# Patient Record
Sex: Male | Born: 1950 | Race: White | Hispanic: No | Marital: Married | Smoking: Never smoker
Health system: Southern US, Community
[De-identification: ages and names within clinical notes are randomized; demographics above are authoritative.]

## PROBLEM LIST (undated history)

## (undated) DIAGNOSIS — I499 Cardiac arrhythmia, unspecified: Secondary | ICD-10-CM

## (undated) HISTORY — PX: PROSTATE SURGERY: SHX751

---

## 2004-07-24 ENCOUNTER — Ambulatory Visit: Payer: Self-pay | Admitting: Unknown Physician Specialty

## 2008-11-02 ENCOUNTER — Emergency Department: Payer: Self-pay | Admitting: Emergency Medicine

## 2008-12-08 ENCOUNTER — Emergency Department: Payer: Self-pay | Admitting: Unknown Physician Specialty

## 2010-01-06 IMAGING — CR DG CHEST 2V
1 series · 3 of 3 positions shown · non-contrast
Comparison: none

REASON FOR EXAM: syncopal episode
COMMENTS:

PROCEDURE:     DXR - DXR CHEST PA (OR AP) AND LATERAL  - November 02, 2008 [DATE]
RESULT:     The lungs are clear. The cardiac silhouette and visualized bony
skeleton are unremarkable.

[Series 1: view not recorded · 0.17mm/px · 3 of 3 slices shown]
[im 1/3]
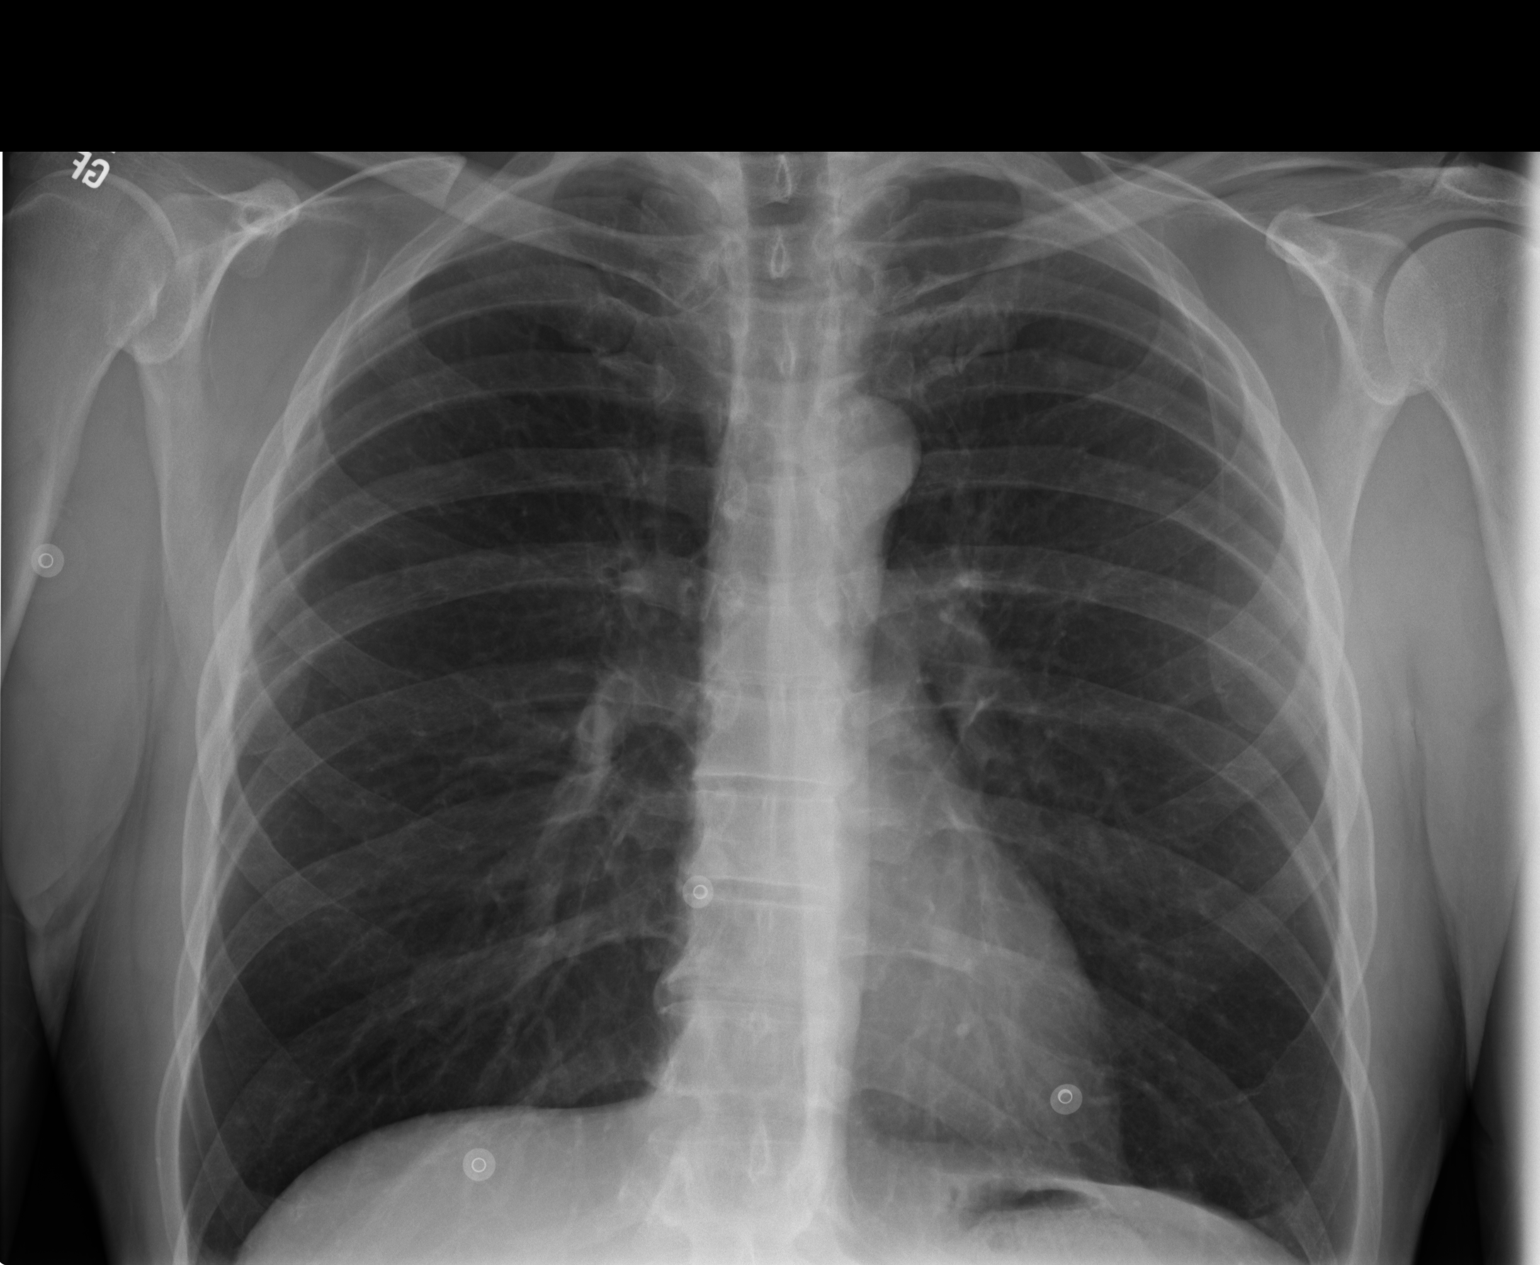
[im 2/3]
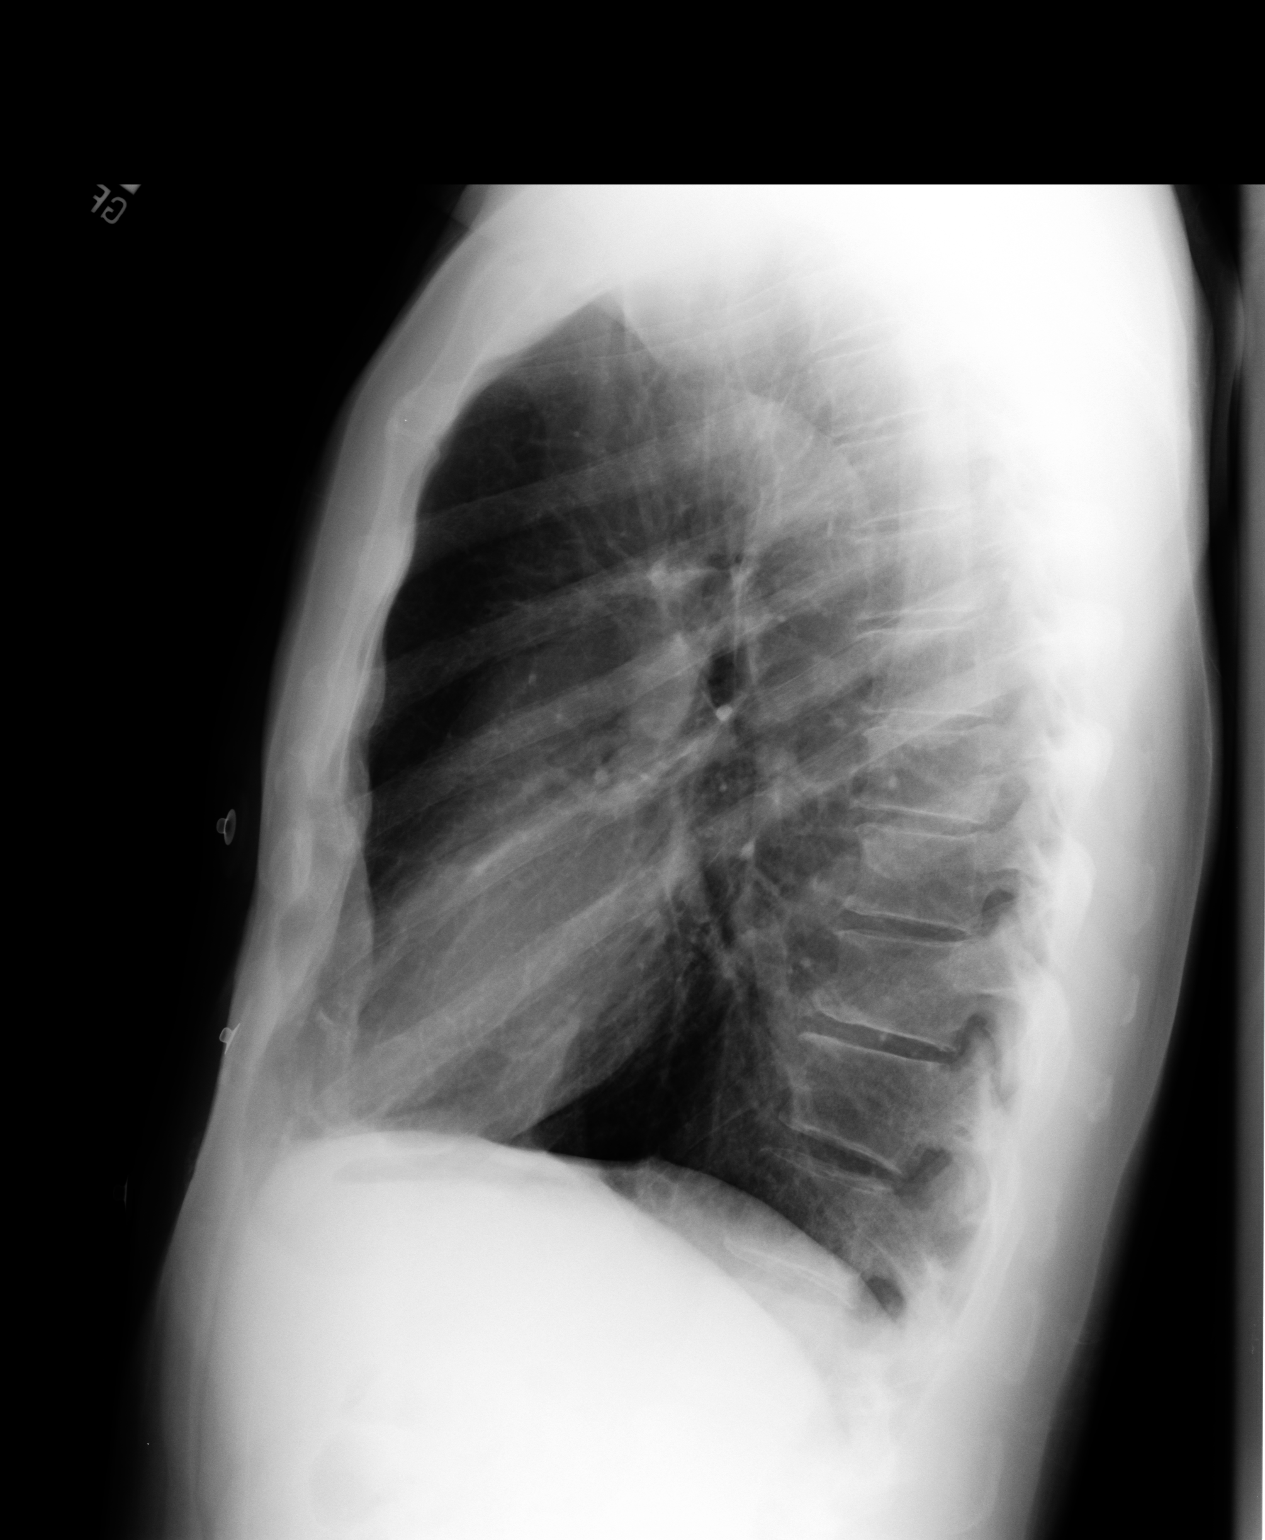
[im 3/3]
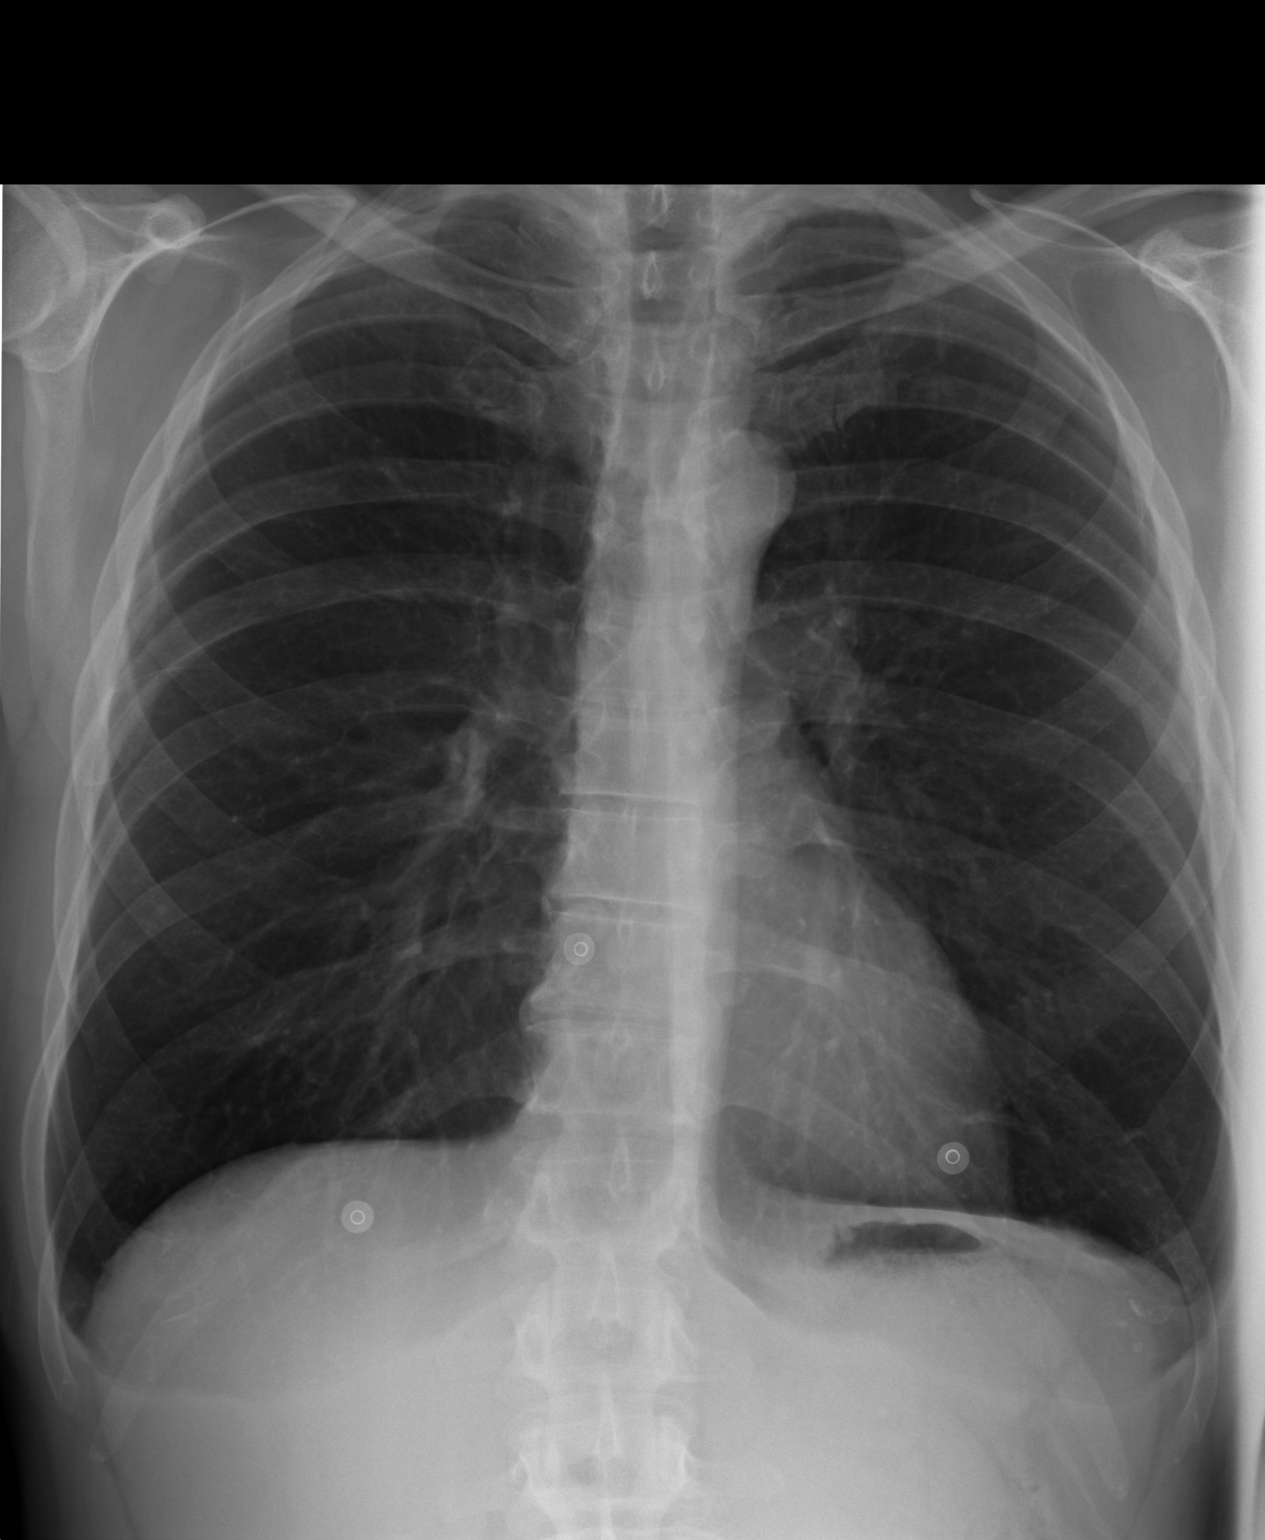

[3 of 3 positions shown; findings below may reference images not displayed]

IMPRESSION: 1. Chest radiograph without evidence of acute cardiopulmonary disease.

## 2010-02-11 IMAGING — CT CT ABD-PELV W/O CM
1 of 2 series · 15 of 32 positions shown, 19 images · non-contrast
Comparison: None

REASON FOR EXAM: (1) right flank pain; (2) right flank pain
COMMENTS:

PROCEDURE:     CT  - CT ABDOMEN AND PELVIS W[DATE]  [DATE]
RESULT:     Indication: Right flank pain
TECHNIQUE: Multiple axial images from the lung bases to the symphysis pubis
were obtained without oral or intravenous contrast.

[Series 2: soft tissue · axial · 0.67mm/px · z∈[-705,-327]mm · 15 of 143 slices shown, 19 images]
[im 11/143  soft-tissue]
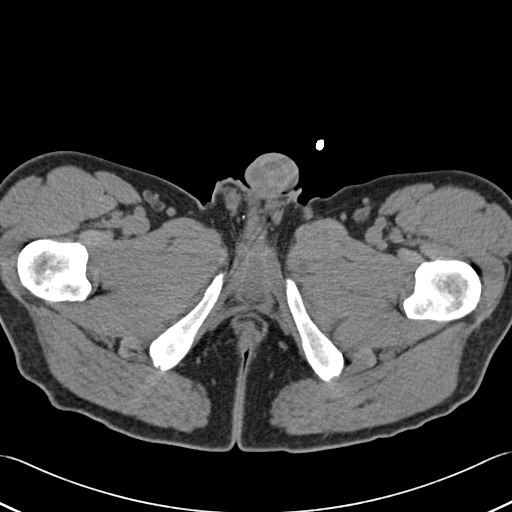
[im 11/143  bone]
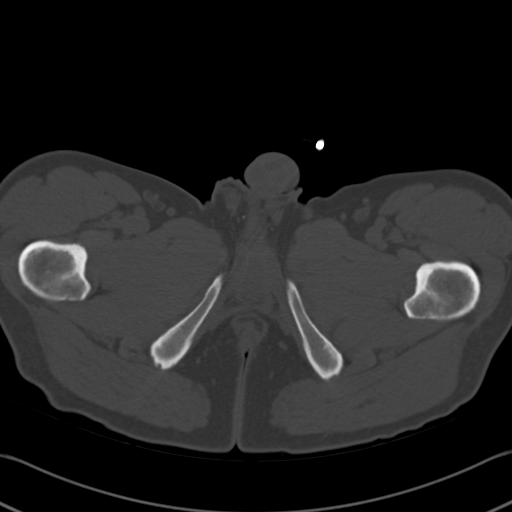
[im 22/143  soft-tissue]
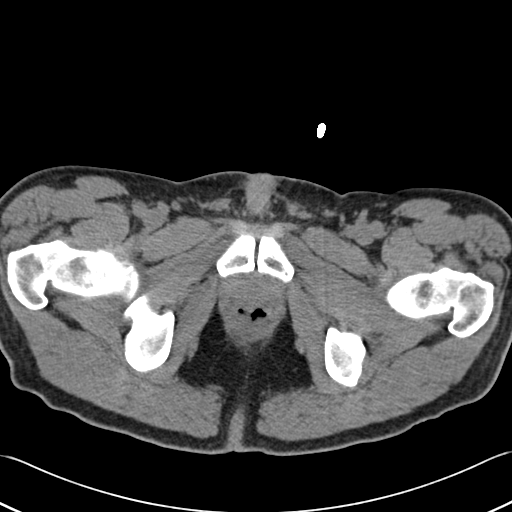
[im 32/143  soft-tissue]
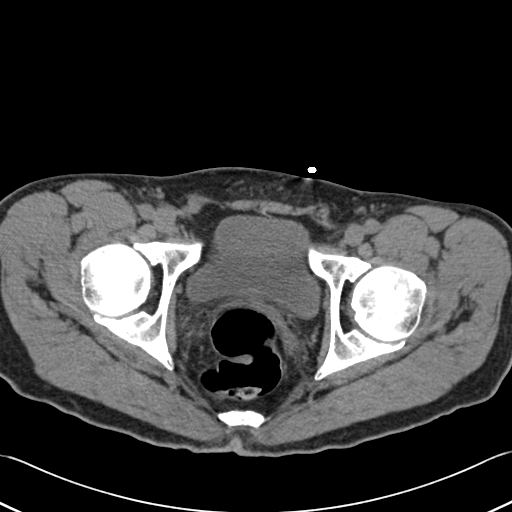
[im 43/143  soft-tissue]
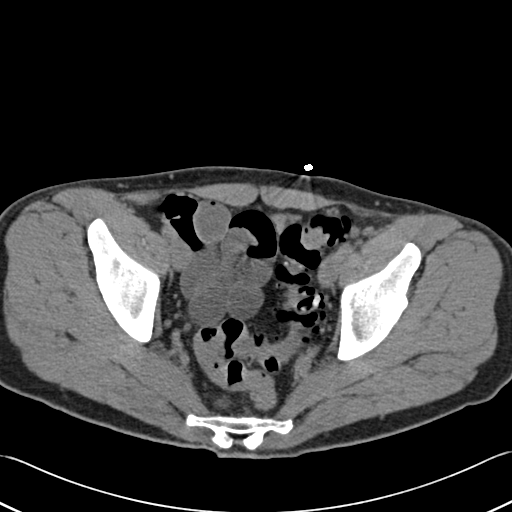
[im 53/143  soft-tissue]
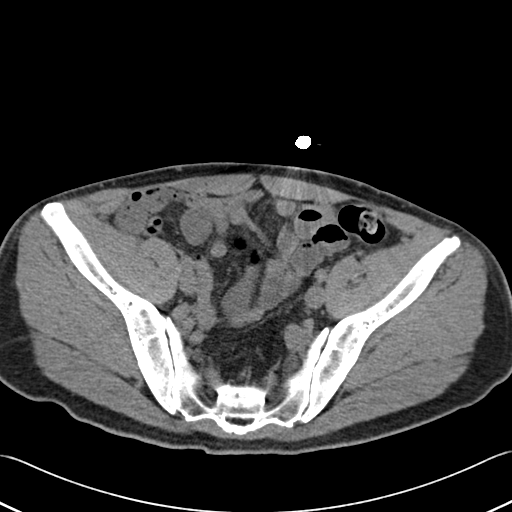
[im 64/143  soft-tissue]
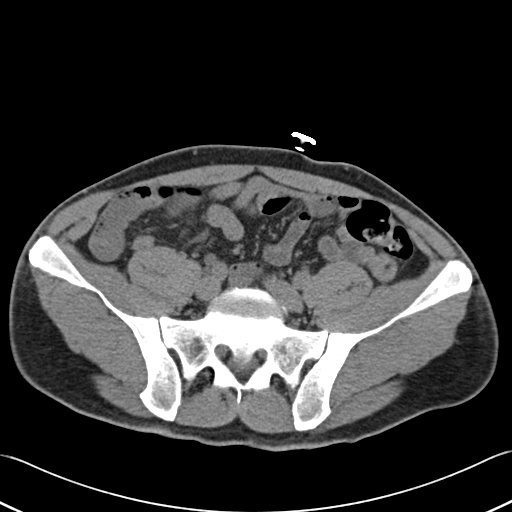
[im 74/143  soft-tissue]
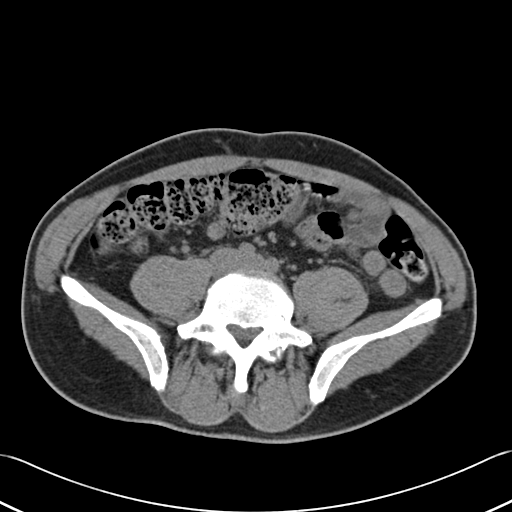
[im 85/143  soft-tissue]
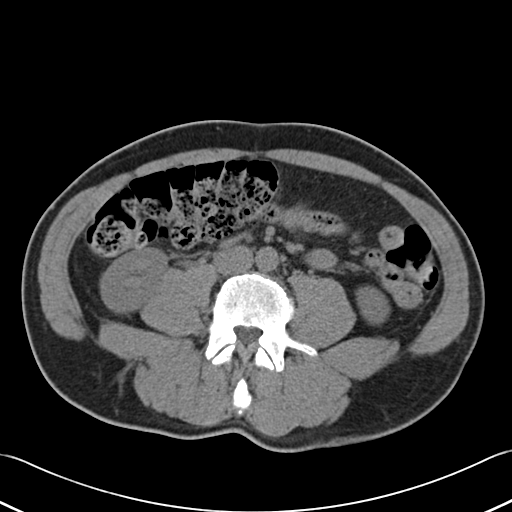
[im 95/143  soft-tissue]
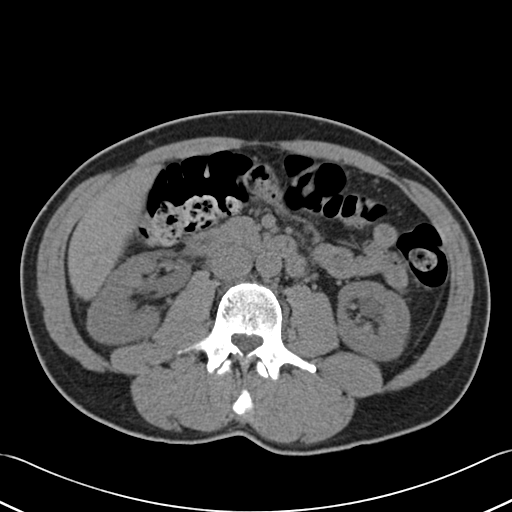
[im 95/143  bone]
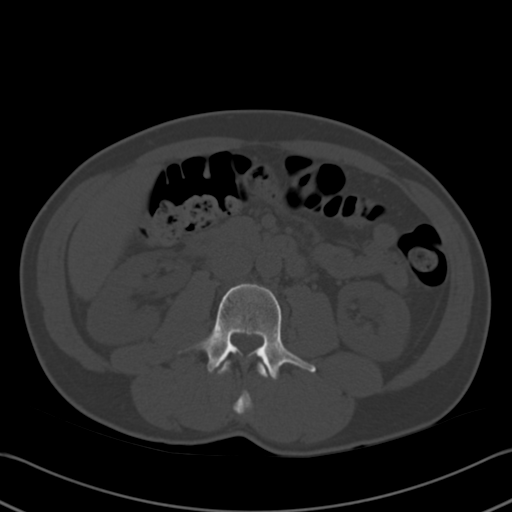
[im 106/143  soft-tissue]
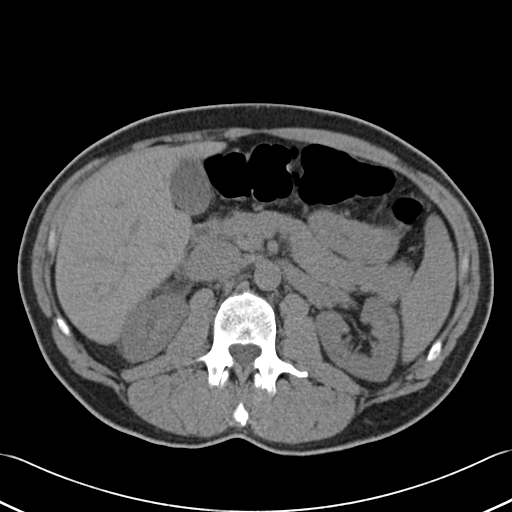
[im 116/143  soft-tissue]
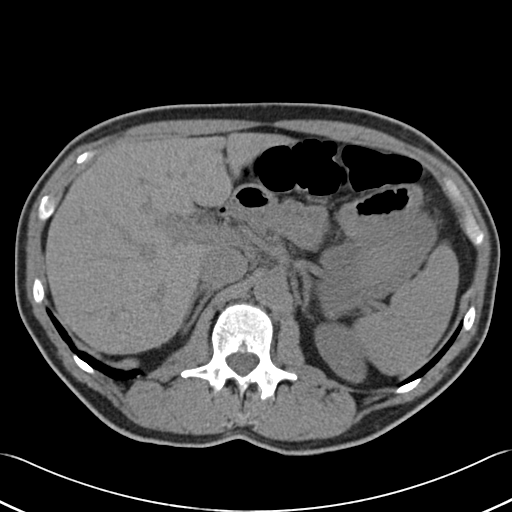
[im 121/143  lung]
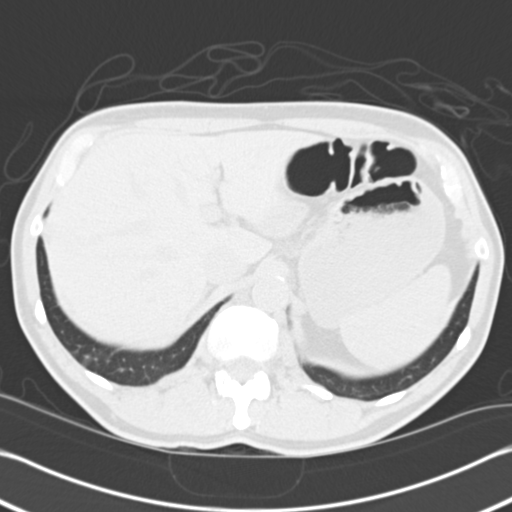
[im 127/143  soft-tissue]
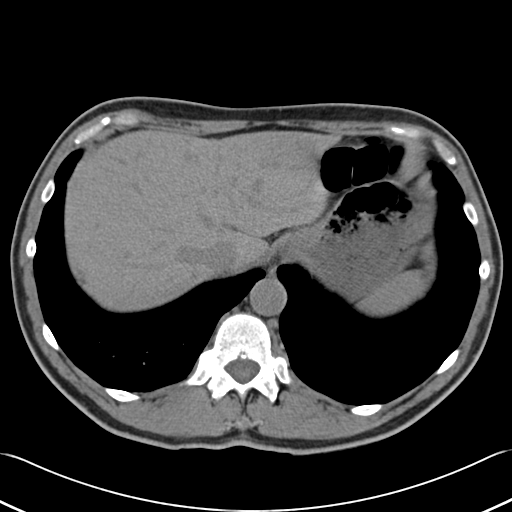
[im 127/143  lung]
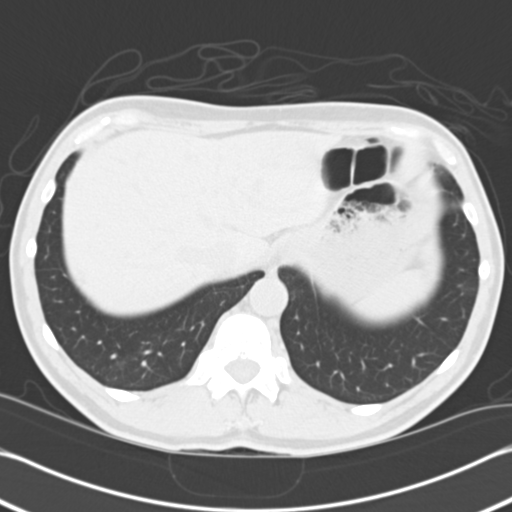
[im 132/143  lung]
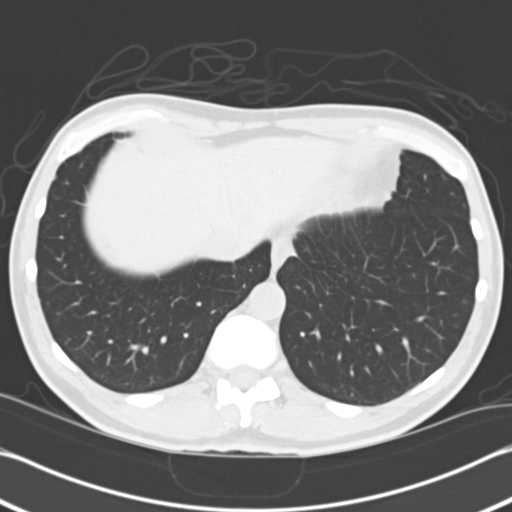
[im 137/143  soft-tissue]
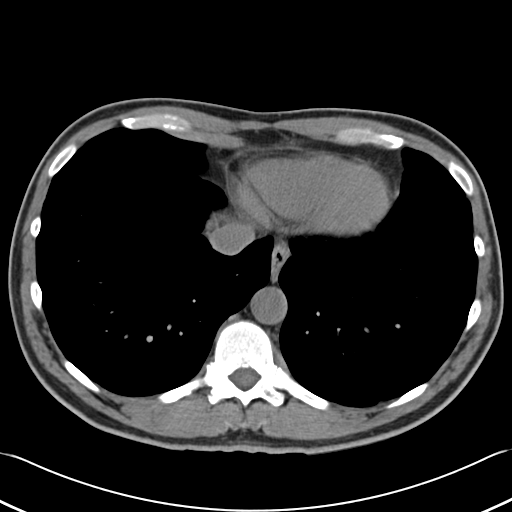
[im 137/143  lung]
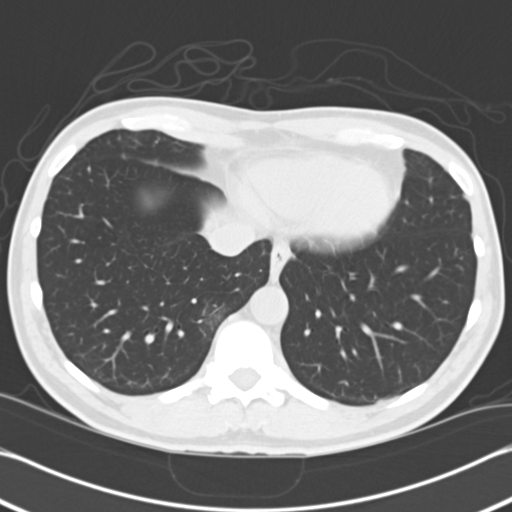

[15 of 32 positions shown; findings below may reference images not displayed]

FINDINGS: The lung bases are clear. There is no pleural or pericardial effusions.

There bilateral nonobstructing renal calculi. There is a 3-4 mm distal right
ureteral calculus just proximal to the ureterovesicular junction with mild
pelvicaliectasis. There is no other ureteral calculus. There is no bladder
calculus. There is mild right perinephric stranding. The kidneys are
symmetric in size without evidence for exophytic mass. The bladder is
unremarkable.

The liver demonstrates no focal abnormality. The gallbladder is
unremarkable. The spleen demonstrates no focal abnormality. The adrenal
glands and pancreas are normal.

The unopacified stomach, duodenum, small intestine, and large intestine are
unremarkable, but evaluation is limited by lack of oral contrast. There is
no pneumoperitoneum, pneumatosis, or portal venous gas. There is no
abdominal or pelvic free fluid. There is no lymphadenopathy.

The abdominal aorta is normal in caliber.

The osseous structures are unremarkable.
IMPRESSION: 1. There is a 3-4 mm distal right ureteral calculus just proximal to the
ureterovesicular junction with mild pelvicaliectasis. There are bilateral
nonobstructing renal calculi.

## 2014-08-09 ENCOUNTER — Ambulatory Visit
Admission: RE | Admit: 2014-08-09 | Discharge: 2014-08-09 | Disposition: A | Discharge status: Home / Self Care | Payer: BLUE CROSS/BLUE SHIELD | Source: Ambulatory Visit | Attending: Unknown Physician Specialty | Admitting: Unknown Physician Specialty

## 2014-08-09 ENCOUNTER — Ambulatory Visit: Payer: BLUE CROSS/BLUE SHIELD | Admitting: Anesthesiology

## 2014-08-09 ENCOUNTER — Encounter: Payer: Self-pay | Admitting: *Deleted

## 2014-08-09 ENCOUNTER — Encounter
Admission: RE | Disposition: A | Discharge status: Home / Self Care | Payer: Self-pay | Source: Ambulatory Visit | Attending: Unknown Physician Specialty

## 2014-08-09 DIAGNOSIS — K640 First degree hemorrhoids: Secondary | ICD-10-CM | POA: Diagnosis not present

## 2014-08-09 DIAGNOSIS — Z1211 Encounter for screening for malignant neoplasm of colon: Secondary | ICD-10-CM | POA: Diagnosis not present

## 2014-08-09 DIAGNOSIS — Z7982 Long term (current) use of aspirin: Secondary | ICD-10-CM | POA: Insufficient documentation

## 2014-08-09 DIAGNOSIS — Z79899 Other long term (current) drug therapy: Secondary | ICD-10-CM | POA: Insufficient documentation

## 2014-08-09 DIAGNOSIS — K5730 Diverticulosis of large intestine without perforation or abscess without bleeding: Secondary | ICD-10-CM | POA: Diagnosis not present

## 2014-08-09 HISTORY — DX: Cardiac arrhythmia, unspecified: I49.9

## 2014-08-09 HISTORY — PX: COLONOSCOPY: SHX5424

## 2014-08-09 SURGERY — COLONOSCOPY
Anesthesia: Monitor Anesthesia Care

## 2014-08-09 MED ORDER — SODIUM CHLORIDE 0.9 % IV SOLN: INTRAVENOUS | Status: DC

## 2014-08-09 MED ORDER — LACTATED RINGERS IV SOLN
INTRAVENOUS | Status: DC
  Administered 2014-08-09 (×2): via INTRAVENOUS

## 2014-08-09 MED ORDER — PROPOFOL INFUSION 10 MG/ML OPTIME
INTRAVENOUS | Status: DC | PRN
  Administered 2014-08-09: 100 ug/kg/min via INTRAVENOUS

## 2014-08-09 MED ORDER — PROPOFOL 10 MG/ML IV BOLUS
INTRAVENOUS | Status: DC | PRN
  Administered 2014-08-09: 70 mg via INTRAVENOUS

## 2014-08-09 MED ORDER — LIDOCAINE HCL (CARDIAC) 20 MG/ML IV SOLN
INTRAVENOUS | Status: DC | PRN
  Administered 2014-08-09: 50 mg via INTRAVENOUS

## 2014-08-09 NOTE — Anesthesia Preprocedure Evaluation (Deleted)
Anesthesia Evaluation  Patient identified by MRN, date of birth, ID band Patient awake    Reviewed: Allergy & Precautions, H&P , NPO status , Patient's Chart, lab work & pertinent test results, reviewed documented beta blocker date and time   Airway Mallampati: II  TM Distance: >3 FB Neck ROM: Full    Dental   Pulmonary          Cardiovascular Rhythm:Regular Rate:Normal     Neuro/Psych    GI/Hepatic   Endo/Other    Renal/GU      Musculoskeletal   Abdominal   Peds  Hematology   Anesthesia Other Findings   Reproductive/Obstetrics                          Anesthesia Physical Anesthesia Plan  ASA: III  Anesthesia Plan: MAC   Post-op Pain Management:    Induction: Intravenous  Airway Management Planned: Nasal Cannula  Additional Equipment:   Intra-op Plan:   Post-operative Plan:   Informed Consent: I have reviewed the patients History and Physical, chart, labs and discussed the procedure including the risks, benefits and alternatives for the proposed anesthesia with the patient or authorized representative who has indicated his/her understanding and acceptance.     Plan Discussed with: CRNA  Anesthesia Plan Comments:         Anesthesia Quick Evaluation

## 2014-08-09 NOTE — H&P (Signed)
    Primary Care Physician:  Curtis SitesBERT J KLEIN III Primary Gastroenterologist:  Dr. Mechele CollinElliott  Pre-Procedure History & Physical: HPI:  David Robbins is a 64 y.o. male is here for an colonoscopy.   Past Medical History  Diagnosis Date  . Dysrhythmia     Past Surgical History  Procedure Laterality Date  . Prostate surgery      Prior to Admission medications   Medication Sig Start Date End Date Taking? Authorizing Provider  aspirin 81 MG tablet Take 81 mg by mouth daily.   Yes Historical Provider, MD  Bisacodyl (DULCOLAX PO) Take 4 tablets by mouth once.   Yes Historical Provider, MD  cholecalciferol (VITAMIN D) 1000 UNITS tablet Take 1,000 Units by mouth daily.   Yes Historical Provider, MD  ferrous sulfate 325 (65 FE) MG tablet Take 325 mg by mouth daily.   Yes Historical Provider, MD  Multiple Vitamin (MULTIVITAMIN WITH MINERALS) TABS tablet Take 1 tablet by mouth daily.   Yes Historical Provider, MD  Omega-3 Fatty Acids (FISH OIL PO) Take 360 mg by mouth daily.   Yes Historical Provider, MD  polyethylene glycol powder (GLYCOLAX/MIRALAX) powder Take 1 Container by mouth once.   Yes Historical Provider, MD    Allergies as of 07/25/2014  . (Not on File)    History reviewed. No pertinent family history.  History   Social History  . Marital Status: Married    Spouse Name: N/A  . Number of Children: N/A  . Years of Education: N/A   Occupational History  . Not on file.   Social History Main Topics  . Smoking status: Never Smoker   . Smokeless tobacco: Never Used  . Alcohol Use: No  . Drug Use: No  . Sexual Activity: Not on file   Other Topics Concern  . Not on file   Social History Narrative  . No narrative on file    Review of Systems: See HPI, otherwise negative ROS  Physical Exam: BP 134/82 mmHg  Pulse 80  Temp(Src) 97.8 F (36.6 C) (Tympanic)  Resp 16  Ht 5\' 8"  (1.727 m)  Wt 71.668 kg (158 lb)  BMI 24.03 kg/m2  SpO2 100% General:   Alert,  pleasant  and cooperative in NAD Head:  Normocephalic and atraumatic. Neck:  Supple; no masses or thyromegaly. Lungs:  Clear throughout to auscultation.    Heart:  Regular rate and rhythm. Abdomen:  Soft, nontender and nondistended. Normal bowel sounds, without guarding, and without rebound.   Neurologic:  Alert and  oriented x4;  grossly normal neurologically.  Impression/Plan: David Robbins is here for an colonoscopy to be performed for screening  Risks, benefits, limitations, and alternatives regarding  colonoscopy have been reviewed with the patient.  Questions have been answered.  All parties agreeable.   Lynnae PrudeELLIOTT, Alyjah Lovingood, MD  08/09/2014, 2:07 PM

## 2014-08-09 NOTE — Anesthesia Preprocedure Evaluation (Addendum)
Anesthesia Evaluation  Patient identified by MRN, date of birth, ID band Patient awake    Reviewed: Allergy & Precautions, H&P , NPO status , Patient's Chart, lab work & pertinent test results, reviewed documented beta blocker date and time   Airway Mallampati: II  TM Distance: >3 FB Neck ROM: Full    Dental   Pulmonary          Cardiovascular     Neuro/Psych    GI/Hepatic   Endo/Other    Renal/GU      Musculoskeletal   Abdominal   Peds  Hematology   Anesthesia Other Findings   Reproductive/Obstetrics                             Anesthesia Physical Anesthesia Plan  ASA: II  Anesthesia Plan: MAC   Post-op Pain Management:    Induction:   Airway Management Planned:   Additional Equipment:   Intra-op Plan:   Post-operative Plan:   Informed Consent: I have reviewed the patients History and Physical, chart, labs and discussed the procedure including the risks, benefits and alternatives for the proposed anesthesia with the patient or authorized representative who has indicated his/her understanding and acceptance.     Plan Discussed with: CRNA  Anesthesia Plan Comments:         Anesthesia Quick Evaluation

## 2014-08-09 NOTE — Op Note (Signed)
[facility] Gastroenterology Patient Name: David Robbins AgentGary Hamman Procedure Date: 08/09/2014 1:47 PM MRN: [id number] Account #: [id number] Date of Birth: 11/18/1950 Admit Type: Outpatient Age: 64 [facility] ROOM 3 Gender: Male Note Status: Finalized Procedure:         Colonoscopy Indications:       Screening for colorectal malignant neoplasm Providers:         Scot Junobert T. Elliott, MD Medicines:         Propofol per Anesthesia Complications:     No immediate complications. Procedure:         Pre-Anesthesia Assessment:                    - After reviewing the risks and benefits, the patient was                     deemed in satisfactory condition to undergo the procedure.                    After obtaining informed consent, the colonoscope was                     passed under direct vision. Throughout the procedure, the                     patient's blood pressure, pulse, and oxygen saturations                     were monitored continuously. The Colonoscope was                     introduced through the anus and advanced to the the cecum,                     identified by appendiceal orifice and ileocecal valve. The                     colonoscopy was performed without difficulty. The patient                     tolerated the procedure well. The quality of the bowel                     preparation was excellent. Findings:      Multiple small-mouthed diverticula were found in the sigmoid colon and       in the descending colon.      Internal hemorrhoids were found during endoscopy. The hemorrhoids were       small and Grade I (internal hemorrhoids that do not prolapse).      The exam was otherwise without abnormality. Impression:        - Diverticulosis in the sigmoid colon and in the                     descending colon.                    - Internal hemorrhoids.                    - The examination was otherwise normal.                    - No specimens  collected. Recommendation:    - Repeat colonoscopy in 10 years for screening purposes. Scot Junobert T Elliott, MD 08/09/2014  2:41:59 PM This report has been signed electronically. Number of Addenda: 0 Note Initiated On: 08/09/2014 1:47 PM Scope Withdrawal Time: 0 hours 9 minutes 12 seconds  Total Procedure Duration: 0 hours 21 minutes 31 seconds       [facility]

## 2014-08-09 NOTE — Anesthesia Postprocedure Evaluation (Signed)
  Anesthesia Post-op Note  Patient: David Robbins  Procedure(s) Performed: Procedure(s): COLONOSCOPY (N/A)  Anesthesia type:MAC  Patient location: PACU  Post pain: Pain level controlled  Post assessment: Post-op Vital signs reviewed, Patient's Cardiovascular Status Stable, Respiratory Function Stable, Patent Airway and No signs of Nausea or vomiting  Post vital signs: Reviewed and stable  Last Vitals:  Filed Vitals:   08/09/14 1439  BP: 109/69  Pulse: 71  Temp: 36.3 C  Resp: 14    Level of consciousness: awake, alert  and patient cooperative  Complications: No apparent anesthesia complications

## 2014-08-09 NOTE — Transfer of Care (Signed)
Immediate Anesthesia Transfer of Care Note  Patient: David Robbins  Procedure(s) Performed: Procedure(s): COLONOSCOPY (N/A)  Patient Location: PACU and Endoscopy Unit  Anesthesia Type:MAC  Level of Consciousness: awake, alert  and oriented  Airway & Oxygen Therapy: Patient Spontanous Breathing and Patient connected to nasal cannula oxygen  Post-op Assessment: Report given to RN and Post -op Vital signs reviewed and stable  Post vital signs: Reviewed and stable  Last Vitals:  Filed Vitals:   08/09/14 1439  BP: 109/69  Pulse: 71  Temp: 36.3 C  Resp: 14    Complications: No apparent anesthesia complications

## 2014-08-14 ENCOUNTER — Encounter: Payer: Self-pay | Admitting: Unknown Physician Specialty

## 2022-02-27 ENCOUNTER — Ambulatory Visit
Admission: EM | Admit: 2022-02-27 | Discharge: 2022-02-27 | Disposition: A | Discharge status: Home / Self Care | Payer: Medicare Other | Attending: Physician Assistant | Admitting: Physician Assistant

## 2022-02-27 ENCOUNTER — Encounter: Payer: Self-pay | Admitting: Emergency Medicine

## 2022-02-27 DIAGNOSIS — Z79899 Other long term (current) drug therapy: Secondary | ICD-10-CM | POA: Insufficient documentation

## 2022-02-27 DIAGNOSIS — R059 Cough, unspecified: Secondary | ICD-10-CM | POA: Diagnosis not present

## 2022-02-27 DIAGNOSIS — Z1152 Encounter for screening for COVID-19: Secondary | ICD-10-CM | POA: Insufficient documentation

## 2022-02-27 DIAGNOSIS — J069 Acute upper respiratory infection, unspecified: Secondary | ICD-10-CM | POA: Insufficient documentation

## 2022-02-27 LAB — RESP PANEL BY RT-PCR (FLU A&B, COVID) ARPGX2
Influenza A by PCR: NEGATIVE
Influenza B by PCR: NEGATIVE
SARS Coronavirus 2 by RT PCR: NEGATIVE

## 2022-02-27 LAB — GROUP A STREP BY PCR: Group A Strep by PCR: NOT DETECTED

## 2022-02-27 MED ORDER — PROMETHAZINE-DM 6.25-15 MG/5ML PO SYRP: 5.0000 mL | ORAL_SOLUTION | Freq: Four times a day (QID) | ORAL | 0 refills | Status: AC | PRN

## 2022-02-27 MED ORDER — AMOXICILLIN-POT CLAVULANATE 875-125 MG PO TABS: 1.0000 | ORAL_TABLET | Freq: Two times a day (BID) | ORAL | 0 refills | Status: AC

## 2022-02-27 MED ORDER — LIDOCAINE VISCOUS HCL 2 % MT SOLN: 15.0000 mL | OROMUCOSAL | 0 refills | Status: AC | PRN

## 2022-02-27 MED ORDER — BENZONATATE 100 MG PO CAPS: 100.0000 mg | ORAL_CAPSULE | Freq: Three times a day (TID) | ORAL | 0 refills | Status: AC

## 2022-02-27 NOTE — Discharge Instructions (Signed)
Your symptoms today are most likely being caused by a virus and should steadily improve in time it can take up to 7 to 10 days before you truly start to see a turnaround however things will get better if you have not seen any improvement in your symptoms by day 7 you may return to the pharmacy on Wednesday and pick up Augmentin and begin use, taking every morning and every evening for 7 days  COVID, flu and strep testing are negative  In the meantime you may gargle and spit lidocaine solution every 4 hours as needed to comfort the throat, this will provide a temporary numbing effect  You may use Tessalon pill every 8 hours to help calm your coughing  You may use cough syrup every 6 hours as needed for additional comfort, be mindful this medication will make you feel drowsy    You can take Tylenol and/or Ibuprofen as needed for fever reduction and pain relief.   For cough: honey 1/2 to 1 teaspoon (you can dilute the honey in water or another fluid).  You can also use guaifenesin and dextromethorphan for cough. You can use a humidifier for chest congestion and cough.  If you don't have a humidifier, you can sit in the bathroom with the hot shower running.      For sore throat: try warm salt water gargles, cepacol lozenges, throat spray, warm tea or water with lemon/honey, popsicles or ice, or OTC cold relief medicine for throat discomfort.   For congestion: take a daily anti-histamine like Zyrtec, Claritin, and a oral decongestant, such as pseudoephedrine.  You can also use Flonase 1-2 sprays in each nostril daily.   It is important to stay hydrated: drink plenty of fluids (water, gatorade/powerade/pedialyte, juices, or teas) to keep your throat moisturized and help further relieve irritation/discomfort.

## 2022-02-27 NOTE — ED Triage Notes (Signed)
Patient c/o HA, bodyaches, cough and sore throat that started 3 days ago.  Patient denies fevers.

## 2022-02-27 NOTE — ED Provider Notes (Signed)
[facility]    CSN: [id number] Arrival date & time: 02/27/22  7371      History   Chief Complaint Chief Complaint  Patient presents with   Sore Throat   Cough    HPI David Robbins is a 71 y.o. male.   Patient presents with nasal congestion, rhinorrhea, bilateral ear pain, sore throat and nonproductive dry cough with wheezing and body aches 3 days.  No known sick contacts.  Tolerating food and liquids.  Has attempted use of DayQuil, NyQuil which has been minimally effective.  Denies respiratory history.  Non-smoker.  Denies shortness of breath, chest pain or tightness, fevers.   Past Medical History:  Diagnosis Date   Dysrhythmia     There are no problems to display for this patient.   Past Surgical History:  Procedure Laterality Date   COLONOSCOPY N/A 08/09/2014   Procedure: COLONOSCOPY;  Surgeon: Scot Jun, MD;  Location: [facility] ENDOSCOPY;  Service: Endoscopy;  Laterality: N/A;   PROSTATE SURGERY         Home Medications    Prior to Admission medications   Medication Sig Start Date End Date Taking? Authorizing Provider  cholecalciferol (VITAMIN D) 1000 UNITS tablet Take 1,000 Units by mouth daily.   Yes [provider]  ferrous sulfate 325 (65 FE) MG tablet Take 325 mg by mouth daily.   Yes [provider]  glucosamine-chondroitin 500-400 MG tablet Take 1 tablet by mouth 3 (three) times daily.   Yes [provider]  Multiple Vitamin (MULTIVITAMIN WITH MINERALS) TABS tablet Take 1 tablet by mouth daily.   Yes [provider]  Omega-3 Fatty Acids (FISH OIL PO) Take 360 mg by mouth daily.   Yes [provider]  aspirin 81 MG tablet Take 81 mg by mouth daily.    [provider]  polyethylene glycol powder (GLYCOLAX/MIRALAX) powder Take 1 Container by mouth once.    [provider]    Family History History reviewed. No pertinent family history.  Social History Social History    Tobacco Use   Smoking status: Never   Smokeless tobacco: Never  Vaping Use   Vaping Use: Never used  Substance Use Topics   Alcohol use: No   Drug use: No     Allergies   Patient has no known allergies.   Review of Systems Review of Systems  Constitutional: Negative.   HENT:  Positive for congestion, ear pain, rhinorrhea and sore throat. Negative for dental problem, drooling, ear discharge, facial swelling, hearing loss, mouth sores, nosebleeds, postnasal drip, sinus pressure, sinus pain, sneezing, tinnitus, trouble swallowing and voice change.   Respiratory:  Positive for cough and wheezing. Negative for apnea, choking, chest tightness, shortness of breath and stridor.   Cardiovascular: Negative.   Gastrointestinal: Negative.   Musculoskeletal:  Positive for myalgias. Negative for arthralgias, back pain, gait problem, joint swelling, neck pain and neck stiffness.  Skin: Negative.      Physical Exam Triage Vital Signs ED Triage Vitals  Enc Vitals Group     BP 02/27/22 0909 (!) 163/82     Pulse Rate 02/27/22 0909 86     Resp 02/27/22 0909 16     Temp 02/27/22 0909 98.7 F (37.1 C)     Temp Source 02/27/22 0909 Oral     SpO2 02/27/22 0909 95 %     Weight 02/27/22 0906 155 lb (70.3 kg)     Height 02/27/22 0906 5\' 8"  (1.727 m)  Head Circumference --      Peak Flow --      Pain Score 02/27/22 0906 10     Pain Loc --      Pain Edu? --      Excl. in GC? --    No data found.  Updated Vital Signs BP (!) 163/82 (BP Location: Right Arm)   Pulse 86   Temp 98.7 F (37.1 C) (Oral)   Resp 16   Ht 5\' 8"  (1.727 m)   Wt 155 lb (70.3 kg)   SpO2 95%   BMI 23.57 kg/m   Visual Acuity Right Eye Distance:   Left Eye Distance:   Bilateral Distance:    Right Eye Near:   Left Eye Near:    Bilateral Near:     Physical Exam Constitutional:      Appearance: Normal appearance. He is well-developed.  HENT:     Head: Normocephalic.     Right Ear: Tympanic membrane and  ear canal normal.     Left Ear: Tympanic membrane and ear canal normal.     Nose: Congestion and rhinorrhea present.     Mouth/Throat:     Mouth: Mucous membranes are moist.     Pharynx: Posterior oropharyngeal erythema present.     Tonsils: No tonsillar exudate. 0 on the right. 0 on the left.  Eyes:     Extraocular Movements: Extraocular movements intact.  Cardiovascular:     Rate and Rhythm: Normal rate and regular rhythm.     Pulses: Normal pulses.     Heart sounds: Normal heart sounds.  Pulmonary:     Effort: Pulmonary effort is normal.     Breath sounds: Normal breath sounds.  Musculoskeletal:     Cervical back: Normal range of motion.  Lymphadenopathy:     Cervical: Cervical adenopathy present.  Skin:    General: Skin is warm and dry.  Neurological:     General: No focal deficit present.     Mental Status: He is alert and oriented to person, place, and time.  Psychiatric:        Mood and Affect: Mood normal.        Behavior: Behavior normal.      UC Treatments / Results  Labs (all labs ordered are listed, but only abnormal results are displayed) Labs Reviewed  GROUP A STREP BY PCR  RESP PANEL BY RT-PCR (FLU A&B, COVID) [id number]    EKG   Radiology No results found.  Procedures Procedures (including critical care time)  Medications Ordered in UC Medications - No data to display  Initial Impression / Assessment and Plan / UC Course  I have reviewed the triage vital signs and the nursing notes.  Pertinent labs & imaging results that were available during my care of the patient were reviewed by me and considered in my medical decision making (see chart for details).  Viral URI with cough  While ill-appearing patient is in no signs of distress nor toxic appearing.  Vital signs are stable.  Low suspicion for pneumonia, pneumothorax or bronchitis and therefore will defer imaging.  COVID, flu and strep testing negative. Prescribed viscous lidocaine, Tessalon and  Promethazine DM, watchful wait antibiotic placed at pharmacy, Augmentin prescribed. May use additional over-the-counter medications as needed for supportive care.  May follow-up with urgent care as needed if symptoms persist or worsen.   Final Clinical Impressions(s) / UC Diagnoses   Final diagnoses:  None   Discharge Instructions   None  ED Prescriptions   None    PDMP not reviewed this encounter.   Valinda Hoar, NP 02/27/22 1012

## 2023-03-11 ENCOUNTER — Encounter: Payer: Self-pay | Admitting: *Deleted
